# Patient Record
Sex: Female | Born: 1940 | Race: White | Hispanic: No | Marital: Married
Health system: Southern US, Community
[De-identification: ages and names within clinical notes are randomized; demographics above are authoritative.]

---

## 1998-07-14 ENCOUNTER — Other Ambulatory Visit: Admission: RE | Admit: 1998-07-14 | Discharge: 1998-07-14 | Payer: Self-pay | Admitting: Family Medicine

## 1999-10-25 ENCOUNTER — Encounter: Admission: RE | Admit: 1999-10-25 | Discharge: 1999-10-25 | Payer: Self-pay | Admitting: Family Medicine

## 1999-10-25 ENCOUNTER — Encounter: Payer: Self-pay | Admitting: Family Medicine

## 1999-11-02 ENCOUNTER — Other Ambulatory Visit: Admission: RE | Admit: 1999-11-02 | Discharge: 1999-11-02 | Payer: Self-pay | Admitting: Family Medicine

## 2000-08-24 ENCOUNTER — Encounter (INDEPENDENT_AMBULATORY_CARE_PROVIDER_SITE_OTHER): Payer: Self-pay | Admitting: Specialist

## 2000-08-24 ENCOUNTER — Inpatient Hospital Stay (HOSPITAL_COMMUNITY): Admission: EM | Admit: 2000-08-24 | Discharge: 2000-08-26 | Payer: Self-pay | Admitting: Emergency Medicine

## 2000-08-24 ENCOUNTER — Encounter: Admission: RE | Admit: 2000-08-24 | Discharge: 2000-08-24 | Payer: Self-pay | Admitting: Family Medicine

## 2000-08-24 ENCOUNTER — Encounter: Payer: Self-pay | Admitting: Family Medicine

## 2000-10-29 ENCOUNTER — Encounter: Admission: RE | Admit: 2000-10-29 | Discharge: 2000-10-29 | Payer: Self-pay | Admitting: Family Medicine

## 2000-10-29 ENCOUNTER — Encounter: Payer: Self-pay | Admitting: Family Medicine

## 2001-02-08 ENCOUNTER — Ambulatory Visit (HOSPITAL_COMMUNITY): Admission: RE | Admit: 2001-02-08 | Discharge: 2001-02-08 | Payer: Self-pay | Admitting: Family Medicine

## 2001-03-07 ENCOUNTER — Encounter: Admission: RE | Admit: 2001-03-07 | Discharge: 2001-03-07 | Payer: Self-pay | Admitting: Family Medicine

## 2001-03-07 ENCOUNTER — Encounter: Payer: Self-pay | Admitting: Family Medicine

## 2001-11-04 ENCOUNTER — Encounter: Payer: Self-pay | Admitting: Family Medicine

## 2001-11-04 ENCOUNTER — Encounter: Admission: RE | Admit: 2001-11-04 | Discharge: 2001-11-04 | Payer: Self-pay | Admitting: Family Medicine

## 2002-04-21 ENCOUNTER — Other Ambulatory Visit: Admission: RE | Admit: 2002-04-21 | Discharge: 2002-04-21 | Payer: Self-pay | Admitting: Family Medicine

## 2002-11-18 ENCOUNTER — Encounter (INDEPENDENT_AMBULATORY_CARE_PROVIDER_SITE_OTHER): Payer: Self-pay | Admitting: Specialist

## 2002-11-18 ENCOUNTER — Ambulatory Visit (HOSPITAL_COMMUNITY): Admission: RE | Admit: 2002-11-18 | Discharge: 2002-11-18 | Payer: Self-pay | Admitting: *Deleted

## 2002-11-24 ENCOUNTER — Encounter: Admission: RE | Admit: 2002-11-24 | Discharge: 2002-11-24 | Payer: Self-pay | Admitting: Family Medicine

## 2002-11-24 ENCOUNTER — Encounter: Payer: Self-pay | Admitting: Family Medicine

## 2003-05-18 ENCOUNTER — Encounter: Payer: Self-pay | Admitting: Orthopedic Surgery

## 2003-05-20 ENCOUNTER — Inpatient Hospital Stay (HOSPITAL_COMMUNITY): Admission: RE | Admit: 2003-05-20 | Discharge: 2003-05-23 | Payer: Self-pay | Admitting: Orthopedic Surgery

## 2003-11-26 ENCOUNTER — Encounter: Admission: RE | Admit: 2003-11-26 | Discharge: 2003-11-26 | Payer: Self-pay | Admitting: Family Medicine

## 2004-01-11 ENCOUNTER — Ambulatory Visit (HOSPITAL_BASED_OUTPATIENT_CLINIC_OR_DEPARTMENT_OTHER): Admission: RE | Admit: 2004-01-11 | Discharge: 2004-01-11 | Payer: Self-pay | Admitting: Orthopedic Surgery

## 2004-02-01 ENCOUNTER — Ambulatory Visit (HOSPITAL_BASED_OUTPATIENT_CLINIC_OR_DEPARTMENT_OTHER): Admission: RE | Admit: 2004-02-01 | Discharge: 2004-02-01 | Payer: Self-pay | Admitting: Orthopedic Surgery

## 2004-11-22 ENCOUNTER — Other Ambulatory Visit: Admission: RE | Admit: 2004-11-22 | Discharge: 2004-11-22 | Payer: Self-pay | Admitting: Family Medicine

## 2004-12-12 ENCOUNTER — Encounter: Admission: RE | Admit: 2004-12-12 | Discharge: 2004-12-12 | Payer: Self-pay | Admitting: Family Medicine

## 2006-01-05 ENCOUNTER — Encounter: Admission: RE | Admit: 2006-01-05 | Discharge: 2006-01-05 | Payer: Self-pay | Admitting: Family Medicine

## 2007-01-07 ENCOUNTER — Encounter: Admission: RE | Admit: 2007-01-07 | Discharge: 2007-01-07 | Payer: Self-pay | Admitting: Family Medicine

## 2008-01-10 ENCOUNTER — Encounter: Admission: RE | Admit: 2008-01-10 | Discharge: 2008-01-10 | Payer: Self-pay | Admitting: Family Medicine

## 2009-01-18 ENCOUNTER — Encounter: Admission: RE | Admit: 2009-01-18 | Discharge: 2009-01-18 | Payer: Self-pay | Admitting: Family Medicine

## 2010-01-20 ENCOUNTER — Encounter: Admission: RE | Admit: 2010-01-20 | Discharge: 2010-01-20 | Payer: Self-pay | Admitting: Family Medicine

## 2010-12-26 ENCOUNTER — Other Ambulatory Visit: Payer: Self-pay | Admitting: Family Medicine

## 2010-12-26 DIAGNOSIS — Z780 Asymptomatic menopausal state: Secondary | ICD-10-CM

## 2010-12-26 DIAGNOSIS — Z1239 Encounter for other screening for malignant neoplasm of breast: Secondary | ICD-10-CM

## 2011-01-23 ENCOUNTER — Ambulatory Visit: Payer: Self-pay

## 2011-01-23 ENCOUNTER — Other Ambulatory Visit: Payer: Self-pay

## 2011-02-01 ENCOUNTER — Ambulatory Visit
Admission: RE | Admit: 2011-02-01 | Discharge: 2011-02-01 | Disposition: A | Discharge status: Home / Self Care | Payer: Medicare Other | Source: Ambulatory Visit | Attending: Family Medicine | Admitting: Family Medicine

## 2011-02-01 DIAGNOSIS — Z1239 Encounter for other screening for malignant neoplasm of breast: Secondary | ICD-10-CM

## 2011-02-01 DIAGNOSIS — Z780 Asymptomatic menopausal state: Secondary | ICD-10-CM

## 2011-04-21 NOTE — Op Note (Signed)
[name], [name]                           ACCOUNT NO.:  [id number]   MEDICAL RECORD NO.:  [id number]                   PATIENT TYPE:  INP   LOCATION:  2550                                 FACILITY:  [facility]   PHYSICIAN:  Robert A. Thurston Hole, M.D.              DATE OF BIRTH:  09-02-1941   DATE OF PROCEDURE:  05/20/2003  DATE OF DISCHARGE:                                 OPERATIVE REPORT   PREOPERATIVE DIAGNOSIS:  Right knee degenerative joint disease.   POSTOPERATIVE DIAGNOSIS:  Right knee degenerative joint disease.   PROCEDURES:  1. Right total knee replacement using [employer] Scorpio total knee system     with number 9 submitted femoral component, number 7 submitted tibial     component with 12-mm polyethelene flex tibial spacer, 26-mm polyethelene     submitted patella component.  2. Right knee lateral retinacular release.   SURGEON:  Dr. Salvatore Marvel.   ASSISTANT:  Kirstin Tomasa Rand, P.A.   ANESTHESIA:  General.   OPERATIVE TIME:  One hour 25 minutes.   COMPLICATIONS:  None.   DESCRIPTION OF PROCEDURE:  [name] was brought to the operating room on  May 20, 2003, placed on the operative table in the supine position.  After  an adequate level of general anesthesia was obtained, her right knee was  examined under anesthesia.  Range of motion from -5 to 125 degrees with mild  varus deformity.  Knee with stable ligamentous exam with normal patella  tracking.  She had a Foley catheter placed under sterile conditions and  received Ancef 1 gram IV preoperatively for prophylaxis.  Her right leg was  prepped using sterile Duraprep and draped using sterile technique.  Leg was  then exsanguinated and a thigh tourniquet elevated at 375 mm.  Initially,  through a 15-cm longitudinal incision, based over the patella, initial  exposure was made.  The underlying subcutaneous tissues were incised in line  with the skin incision.  A median arthrotomy was performed, revealing an  excessive amount of normal-appearing joint fluid.  The articular surfaces  were inspected.  She was found to have grade 4 changes medially, grade 3 and  4 changes laterally, and grade 3 and 4 changes in the patellofemoral joint.  The medial and lateral meniscal remnants were removed as well as osteophytes  off the femoral condyles and tibial plateau.  Anterior cruciate ligament was  resected.  An intermedullary drill was then drilled up the femoral canal for  placement of the distal femoral cutting jig, which was placed in the  appropriate amount of rotation, and a distal 12-mm cut was made.  The distal  femur was incised.  A number 9 was found to be the appropriate size, and a  number 9 cutting jig was placed, and then these cuts were made.  After this  was done, the proximal tibia was exposed; tibial spines were removed with  an  oscillating saw.  Intermedullary drill drilled down the tibial canal for  placement of the proximal tibial cutting jig, which was placed in the  appropriate amount of rotation, and then a 6-mm proximal cut was made.  After this was done, the 8 Scorpio PCL cutters were placed back on the  distal femur, and these cuts were made.  After this was done, the number 9  femoral trial component was placed, a number 7 tibial-based plate trial  component was placed, and with a 12-mm polyethelene flex tibial spacer,  there was found to be excellent restoration of normal alignment, excellent  stability with range of motion from 0 to 120 degrees with no lift-off on the  tray.  The tibial-based plate was then marked for rotation, and the keel cut  was made.  After this was done, the patella was sized; a 26-mm was found to  be the appropriate size, and a recessed 10 mm x 26 mm cut was made, and  three locking holes were placed.  After this was done, it was felt that all  the components were of excellent size, fit, and stability; they were then  removed.  The knee was then gently  lavage-irrigated with 3 liters of saline  solution.  The proximal tibia was then exposed, and the number 7 tibial-base  plate with cement backing was hammered into position with an excellent fit  with excess cement being removed from around the edges.  The number 9  femoral component with cement backing was hammered into position, also with  an excellent fit with excess cement being removed from around the edges.  The 12-mm polyethelene flex tibial spacer was then locked on the tibial base  plate, the knee taken through a range of motion, 0 to 120 degrees, with  excellent stability with restoration of normal alignment.  The 26-mm  polyethelene cement backed patella was then locked into its recessed hole  and held there with a clamp.  After this was done, it was felt that all of  the components were of excellent size, fit, and stability.  There was  significant lateral patella tracking noted and tightness, and thus a lateral  retinacular release was carried out, and this improved patella tracking to  normal.  After this was done, it was felt that all of the components were of  excellent size, fit, and stability with normal patella tracking.  The wound  was further irrigated, and then the arthrotomy was closed with number 1  Ethibond suture over two medium Hemovac drains.  Subcutaneous tissue was  closed with 0 and 2-0 Vicryl.  Skin closed with skin staples.  Sterile  dressings were applied.  Hemovac injected with 0.25% Marcaine with  epinephrine and 4 mg of morphine and clamped.  Tourniquet was released.  Patient then had a femoral nerve block placed by anesthesia for  postoperative pain control.  She was then awakened and extubated and taken  to the recovery room in stable condition.  Needle and sponge count correct  times two at the end of the case.                                               Robert A. Thurston Hole, M.D.    RAW/MEDQ  D:  05/20/2003  T:  05/20/2003  Job:  [id number]

## 2011-04-21 NOTE — Op Note (Signed)
[facility]  Patient:    Tamara [name], Tamara [name]                          MRN: [id number] Proc. Date: 08/25/00 Adm. Date:  [id number] Disc. Date: [id number] Attending:  Meredith Leeds CC:         Maricela Bo, M.D.   Operative Report  PREOPERATIVE DIAGNOSIS:  Cholelithiasis and chronic cholecystitis with possible choledocholithiasis.  POSTOPERATIVE DIAGNOSIS:  Cholelithiasis and chronic cholecystitis with possible choledocholithiasis.  OPERATION PERFORMED:  Laparoscopic cholecystectomy with cholangiogram.  SURGEON:  Zigmund Daniel, M.D.  ASSISTANT:  Currie Paris, M.D.  ANESTHESIA:  General.  DESCRIPTION OF PROCEDURE:  After adequate general anesthesia and routine preparation and draping of the abdomen, I made a short transverse infraumbilical incision and dissected down through the fat to the fascia and opened it longitudinally in the midline.  I then opened the peritoneum bluntly and placed a 0 Vicryl pursestring suture and used it to secure a Hasson cannula.  After inflating the abdomen with CO2, I noted no abnormalities except for adhesions in the pelvis from previous surgery and moderately inflamed appearance of the gallbladder.  The liver appeared to be normal.  I placed three additional ports and positioned the patient routinely head up and left tilted and elevated the fundus of the gallbladder.  I took down the adhesions carefully as the colon was closely adherent to the gallbladder.  I ensured that no injury occurred.   I then retracted the infundibulum of the gallbladder laterally and dissected out the cystic duct and the cystic artery. I clearly identified the cystic duct emerging from the infundibulum of the gallbladder.  I put a clip across the most proximal part of the cytic duct, then made a cut in it and inserted a cholangiogram catheter for cholangiogram because the patient had recent history of dark urine and had  moderately elevated transaminases on her preoperative lab.  The cholangiogram showed a generous sized common bile duct but it tapered very nicely and there were no filling defects and there was rapid and easy drainage into the duodenum.  I felt that it did not show any choledocholithiasis.  I did remove one small stone from the cystic duct when I made the hole in it.  After removing the cholangiogram catheter, I then placed two clips distally on the cystic duct and divided it.  I placed three clips on the cystic artery and divided betweem the two closer to the liver. I then dissected the liver from the gallbladder using the spatula cautery.  One small hole was made in it and I irrigated the bile away.  After detaching the gallbladder from the liver, I placed it in a plastic pouch and removed it through the umbilical incision and tied the pursestring suture.  I got good hemostasis in the gallbladder fossa using cautery.  I copiously irrigated the right upper quadrant and removed the irrigant.  The patient tolerated the procedure well.  I anesthetized all the incisions with 0.5% Marcaine with epinephrine.  I removed the lateral ports ____________  and saw that there was no bleeding.  After releasing the CO2 I closed all incisions with intercuticular 4-0 Vicryl and Steri-Strips.  The patient tolerated the procedure well.DD:  08/25/00 TD:  08/27/00 Job: [id number]

## 2011-04-21 NOTE — Discharge Summary (Signed)
NAMEAZARAH, DACY                           ACCOUNT NO.:  [id number]   MEDICAL RECORD NO.:  [id number]                   PATIENT TYPE:  INP   LOCATION:  5012                                 FACILITY:  [facility]   PHYSICIAN:  Elana Alm. Thurston Hole, M.D.              DATE OF BIRTH:  [birth year]   DATE OF ADMISSION:  05/20/2003  DATE OF DISCHARGE:  05/23/2003                                 DISCHARGE SUMMARY   ADMISSION DIAGNOSES:  1. End-stage degenerative joint disease right knee.  2. Depression.   DISCHARGE DIAGNOSES:  1. End-stage degenerative joint disease right knee.  2. Depression.   HISTORY OF PRESENT ILLNESS:  The patient is a 70 year old white female with  a history of end-stage DJD of her right knee.  She has tried anti-  inflammatories and cortisone, all without success.  At this point in time,  she has pain at night, pain at rest, pain with ambulation.  She understands  the risks and benefits and possible complications of a right total knee  replacement and is without question.   PROCEDURE:  On May 20, 2003, the patient underwent a right total knee  replacement by Dr. Thurston Hole and a right femoral nerve block by anesthesia.   HOSPITAL COURSE:  She was admitted postoperatively for DVT prophylaxis, pain  control and physical therapy.  On postoperative day #1, T-max was 100.7,  hemoglobin 10.6, INR 1.0.  Surgical wound was well approximated.  Her drain  was discontinued.  Her PCA was discontinued.  She was started on Percocet  for pain and started with physical therapy.  She ambulated five feet with  minimal assist.   On postoperative day #2, T-max was 100.7, hemoglobin 10.2, INR 1.4.  Surgical wound was well approximated.  Her IV was Hep-Locked.  Her Foley was  discontinued.  Discharge planning was started.  She continued with physical  therapy and continued to progress.  On postoperative day #3, the patient was  discharged to home in stable condition.  She was afebrile.  Her  INR was 1.4.  Her hemoglobin was 9.9.  She was metabolically stable.   DISPOSITION:  She was discharged to home, weightbearing as tolerated, on a  regular diet.   DISCHARGE MEDICATIONS:  1. Percocet one to two q.4-6h. p.r.n. pain.  2. Coumadin 2.5 mg three tablets daily.  3. Zoloft 100 mg one tablet daily.  4. Xanax 0.25 mg p.r.n.  5. Iron 325 mg one p.o. b.i.d.  6. Colace 100 mg one p.o. b.i.d.  7. Senokot S two tablets before dinner.    DISCHARGE INSTRUCTIONS:  She will be receiving home health physical therapy  and a nurse to draw her Coumadin levels.  She has been instructed to call  with temperature greater than 100, increased pain, increased drainage.  She  will also call to make an appointment to follow up with Dr. Thurston Hole May 26, 2003, or May 28, 2003.      Kirstin Shepperson, P.A.                  Robert A. Thurston Hole, M.D.    KS/MEDQ  D:  07/01/2003  T:  07/02/2003  Job:  [id number]

## 2011-04-21 NOTE — Op Note (Signed)
[name], Tamara [name]                           ACCOUNT NO.:  [id number]   MEDICAL RECORD NO.:  [id number]                   PATIENT TYPE:  AMB   LOCATION:  DSC                                  FACILITY:  [facility]   PHYSICIAN:  Robert A. Thurston Hole, M.D.              DATE OF BIRTH:  [birth year]   DATE OF PROCEDURE:  01/11/2004  DATE OF DISCHARGE:                                 OPERATIVE REPORT   PREOPERATIVE DIAGNOSIS:  Right carpal tunnel syndrome.   POSTOPERATIVE DIAGNOSIS:  Right carpal tunnel syndrome.   OPERATION PERFORMED:  Right carpal tunnel release.   SURGEON:  Elana Alm. Thurston Hole, M.D.   ASSISTANT:  Julien Girt, P.A.   ANESTHESIA:  IV regional.   OPERATIVE TIME:  20 minutes.   COMPLICATIONS:  None.   INDICATIONS FOR PROCEDURE:  [name] is a 70 year old woman who has had  painful recurrent right carpal tunnel syndrome not responsive to  conservative care and is now to undergo right carpal tunnel release.   DESCRIPTION OF PROCEDURE:  [name] was brought to the operating room on  January 11, 2004 and placed on the operating table in the supine position.  After an adequate level of intravenous regional anesthesia was obtained, the  right hand and arm was prepped using sterile technique.  Initially through a  4 cm palmar incision, initial exposure was made.  The underlying  subcutaneous tissues were incised in line with the skin incision.  The  transverse carpal ligament was exposed at the level of the wrist flexion  crease and carefully incised with a hemostat used to protect the median  nerve.  The entire transverse carpal ligament was released distally at the  level of the superficial palmar arch carefully protecting this and released  proximally approximately 3 inches proximal to the wrist flexion crease,  carefully protecting the palmar cutaneous branch of the median nerve.  The  median nerve itself was found to be significantly flattened and compressed  but no  other pathology was noted.  The wound was then irrigated and closed  using interrupted 4-0 nylon suture and injected with 0.25% Marcaine.  Sterile dressings were applied.  Tourniquet was released and the patient was  awakened and taken to recovery room in stable condition.   FOLLOW UP:  [name] will be followed as an outpatient on Vicodin and  Naprosyn.  See her back in the office in a week for wound check and follow  up.                                               Robert A. Thurston Hole, M.D.    RAW/MEDQ  D:  01/11/2004  T:  01/11/2004  Job:  [id number]

## 2011-04-21 NOTE — Op Note (Signed)
Tamara [name], Tamara [name]                           ACCOUNT NO.:  [id number]   MEDICAL RECORD NO.:  [id number]                   PATIENT TYPE:  AMB   LOCATION:  DSC                                  FACILITY:  [facility]   PHYSICIAN:  Robert A. Thurston Hole, M.D.              DATE OF BIRTH:  11/16/1941   DATE OF PROCEDURE:  02/01/2004  DATE OF DISCHARGE:                                 OPERATIVE REPORT   PREOPERATIVE DIAGNOSIS:  Left carpal tunnel syndrome.   POSTOPERATIVE DIAGNOSIS:  Left carpal tunnel syndrome.   PROCEDURE:  Left carpal tunnel release.   SURGEON:  Elana Alm. Thurston Hole, M.D.   ASSISTANT:  Julien Girt, P.A.   ANESTHESIA:  IV regional.   OPERATIVE TIME:  Was 20 minutes.   COMPLICATIONS:  None.   INDICATIONS FOR PROCEDURE:  The patient is a 70 year old woman who has had  significant left carpal tunnel syndrome longstanding, increasing in nature,  not responsive to conservative care, and is now to undergo a release.   DESCRIPTION OF PROCEDURE:  The patient was brought to the operating room on  February 01, 2004, and placed on the operating room table in the supine  position.  After an adequate level of IV regional anesthesia was obtained,  her left arm and hand were prepped using sterile DuraPrep and draped using a  sterile technique.  Originally through a 4.0 cm palmar incision, initial  exposure was made.  The underlying subcutaneous tissues were incised in line  with the skin incision.  The transverse carpal ligament was exposed at a  level of the wrist flexion crease and carefully incised revealing the  underlying median nerve, which was carefully protected with a hemostat,  while the entire transverse carpal ligament was released distally to the  level of the superficial palmar arch, carefully protecting this, and  released proximally approximately three inches proximal to the wrist flexion  crease, carefully protecting the palmar cutaneous branch of the median  nerve.   The median nerve itself was found to be significantly flattened and  compressed, but no other pathology was noted.  The wound was then irrigated  and closed using interrupted #3-0 nylon suture and injected with 0.25%  Marcaine.  Sterile compressive dressings were applied.  The tourniquet was  released and the patient was awakened and taken to the recovery room in  stable condition.   FOLLOW-UP CARE:  The patient will be followed as an outpatient on Vicodin  and Naprosyn.  I will see her back in the office in one week for wound check  and followup.                                               Robert A. Thurston Hole, M.D.    RAW/MEDQ  D:  02/01/2004  T:  02/01/2004  Job:  [id number]

## 2012-01-26 ENCOUNTER — Other Ambulatory Visit: Payer: Self-pay | Admitting: Family Medicine

## 2012-01-26 DIAGNOSIS — Z1231 Encounter for screening mammogram for malignant neoplasm of breast: Secondary | ICD-10-CM

## 2012-02-08 ENCOUNTER — Ambulatory Visit
Admission: RE | Admit: 2012-02-08 | Discharge: 2012-02-08 | Disposition: A | Discharge status: Home / Self Care | Payer: Medicare Other | Source: Ambulatory Visit | Attending: Family Medicine | Admitting: Family Medicine

## 2012-02-08 DIAGNOSIS — Z1231 Encounter for screening mammogram for malignant neoplasm of breast: Secondary | ICD-10-CM

## 2013-02-04 ENCOUNTER — Other Ambulatory Visit: Payer: Self-pay

## 2013-02-04 DIAGNOSIS — Z1231 Encounter for screening mammogram for malignant neoplasm of breast: Secondary | ICD-10-CM

## 2013-03-05 ENCOUNTER — Ambulatory Visit
Admission: RE | Admit: 2013-03-05 | Discharge: 2013-03-05 | Disposition: A | Discharge status: Home / Self Care | Payer: Medicare Other | Source: Ambulatory Visit

## 2013-03-05 DIAGNOSIS — Z1231 Encounter for screening mammogram for malignant neoplasm of breast: Secondary | ICD-10-CM

## 2014-02-24 ENCOUNTER — Other Ambulatory Visit: Payer: Self-pay | Admitting: Family Medicine

## 2014-02-24 DIAGNOSIS — Z1231 Encounter for screening mammogram for malignant neoplasm of breast: Secondary | ICD-10-CM

## 2014-03-06 ENCOUNTER — Ambulatory Visit
Admission: RE | Admit: 2014-03-06 | Discharge: 2014-03-06 | Disposition: A | Discharge status: Home / Self Care | Payer: Medicare Other | Source: Ambulatory Visit | Attending: Family Medicine | Admitting: Family Medicine

## 2014-03-06 DIAGNOSIS — Z1231 Encounter for screening mammogram for malignant neoplasm of breast: Secondary | ICD-10-CM

## 2015-02-01 DIAGNOSIS — Z79899 Other long term (current) drug therapy: Secondary | ICD-10-CM | POA: Diagnosis not present

## 2015-02-01 DIAGNOSIS — E1165 Type 2 diabetes mellitus with hyperglycemia: Secondary | ICD-10-CM | POA: Diagnosis not present

## 2015-02-01 DIAGNOSIS — E780 Pure hypercholesterolemia: Secondary | ICD-10-CM | POA: Diagnosis not present

## 2015-02-01 DIAGNOSIS — F418 Other specified anxiety disorders: Secondary | ICD-10-CM | POA: Diagnosis not present

## 2015-02-22 ENCOUNTER — Other Ambulatory Visit: Payer: Self-pay

## 2015-02-22 DIAGNOSIS — Z1231 Encounter for screening mammogram for malignant neoplasm of breast: Secondary | ICD-10-CM

## 2015-03-12 ENCOUNTER — Ambulatory Visit
Admission: RE | Admit: 2015-03-12 | Discharge: 2015-03-12 | Disposition: A | Discharge status: Home / Self Care | Payer: Medicare Other | Source: Ambulatory Visit

## 2015-03-12 DIAGNOSIS — Z1231 Encounter for screening mammogram for malignant neoplasm of breast: Secondary | ICD-10-CM

## 2015-03-15 DIAGNOSIS — H40003 Preglaucoma, unspecified, bilateral: Secondary | ICD-10-CM | POA: Diagnosis not present

## 2015-08-05 DIAGNOSIS — F418 Other specified anxiety disorders: Secondary | ICD-10-CM | POA: Diagnosis not present

## 2015-08-05 DIAGNOSIS — Z139 Encounter for screening, unspecified: Secondary | ICD-10-CM | POA: Diagnosis not present

## 2015-08-05 DIAGNOSIS — Z9181 History of falling: Secondary | ICD-10-CM | POA: Diagnosis not present

## 2015-08-05 DIAGNOSIS — M159 Polyosteoarthritis, unspecified: Secondary | ICD-10-CM | POA: Diagnosis not present

## 2015-08-05 DIAGNOSIS — E1165 Type 2 diabetes mellitus with hyperglycemia: Secondary | ICD-10-CM | POA: Diagnosis not present

## 2015-08-05 DIAGNOSIS — E780 Pure hypercholesterolemia: Secondary | ICD-10-CM | POA: Diagnosis not present

## 2015-12-01 DIAGNOSIS — H6123 Impacted cerumen, bilateral: Secondary | ICD-10-CM | POA: Diagnosis not present

## 2015-12-01 DIAGNOSIS — J0100 Acute maxillary sinusitis, unspecified: Secondary | ICD-10-CM | POA: Diagnosis not present

## 2016-02-02 ENCOUNTER — Other Ambulatory Visit: Payer: Self-pay

## 2016-02-02 DIAGNOSIS — Z1231 Encounter for screening mammogram for malignant neoplasm of breast: Secondary | ICD-10-CM

## 2016-02-03 ENCOUNTER — Other Ambulatory Visit: Payer: Self-pay | Admitting: Family Medicine

## 2016-02-03 DIAGNOSIS — M159 Polyosteoarthritis, unspecified: Secondary | ICD-10-CM | POA: Diagnosis not present

## 2016-02-03 DIAGNOSIS — Z1389 Encounter for screening for other disorder: Secondary | ICD-10-CM | POA: Diagnosis not present

## 2016-02-03 DIAGNOSIS — E1165 Type 2 diabetes mellitus with hyperglycemia: Secondary | ICD-10-CM | POA: Diagnosis not present

## 2016-02-03 DIAGNOSIS — E2839 Other primary ovarian failure: Secondary | ICD-10-CM

## 2016-02-03 DIAGNOSIS — E7800 Pure hypercholesterolemia, unspecified: Secondary | ICD-10-CM | POA: Diagnosis not present

## 2016-02-28 DIAGNOSIS — E119 Type 2 diabetes mellitus without complications: Secondary | ICD-10-CM | POA: Diagnosis not present

## 2016-02-28 DIAGNOSIS — H401130 Primary open-angle glaucoma, bilateral, stage unspecified: Secondary | ICD-10-CM | POA: Diagnosis not present

## 2016-03-13 ENCOUNTER — Ambulatory Visit
Admission: RE | Admit: 2016-03-13 | Discharge: 2016-03-13 | Disposition: A | Discharge status: Home / Self Care | Payer: Medicare Other | Source: Ambulatory Visit | Attending: Family Medicine | Admitting: Family Medicine

## 2016-03-13 ENCOUNTER — Ambulatory Visit
Admission: RE | Admit: 2016-03-13 | Discharge: 2016-03-13 | Disposition: A | Discharge status: Home / Self Care | Payer: Medicare Other | Source: Ambulatory Visit

## 2016-03-13 ENCOUNTER — Ambulatory Visit: Payer: Medicare Other

## 2016-03-13 DIAGNOSIS — Z780 Asymptomatic menopausal state: Secondary | ICD-10-CM | POA: Diagnosis not present

## 2016-03-13 DIAGNOSIS — E2839 Other primary ovarian failure: Secondary | ICD-10-CM

## 2016-03-13 DIAGNOSIS — Z1231 Encounter for screening mammogram for malignant neoplasm of breast: Secondary | ICD-10-CM | POA: Diagnosis not present

## 2016-03-13 DIAGNOSIS — Z13820 Encounter for screening for osteoporosis: Secondary | ICD-10-CM | POA: Diagnosis not present

## 2016-08-01 DIAGNOSIS — L821 Other seborrheic keratosis: Secondary | ICD-10-CM | POA: Diagnosis not present

## 2016-08-01 DIAGNOSIS — L814 Other melanin hyperpigmentation: Secondary | ICD-10-CM | POA: Diagnosis not present

## 2016-08-01 DIAGNOSIS — L820 Inflamed seborrheic keratosis: Secondary | ICD-10-CM | POA: Diagnosis not present

## 2016-08-08 DIAGNOSIS — M159 Polyosteoarthritis, unspecified: Secondary | ICD-10-CM | POA: Diagnosis not present

## 2016-08-08 DIAGNOSIS — E1165 Type 2 diabetes mellitus with hyperglycemia: Secondary | ICD-10-CM | POA: Diagnosis not present

## 2016-08-08 DIAGNOSIS — Z1389 Encounter for screening for other disorder: Secondary | ICD-10-CM | POA: Diagnosis not present

## 2016-08-08 DIAGNOSIS — Z9181 History of falling: Secondary | ICD-10-CM | POA: Diagnosis not present

## 2016-08-08 DIAGNOSIS — E7800 Pure hypercholesterolemia, unspecified: Secondary | ICD-10-CM | POA: Diagnosis not present

## 2017-02-01 ENCOUNTER — Other Ambulatory Visit: Payer: Self-pay | Admitting: Nurse Practitioner

## 2017-02-01 DIAGNOSIS — Z1231 Encounter for screening mammogram for malignant neoplasm of breast: Secondary | ICD-10-CM

## 2017-03-12 DIAGNOSIS — I10 Essential (primary) hypertension: Secondary | ICD-10-CM | POA: Diagnosis not present

## 2017-03-14 ENCOUNTER — Ambulatory Visit
Admission: RE | Admit: 2017-03-14 | Discharge: 2017-03-14 | Disposition: A | Discharge status: Home / Self Care | Payer: Medicare Other | Source: Ambulatory Visit | Attending: Nurse Practitioner | Admitting: Nurse Practitioner

## 2017-03-14 DIAGNOSIS — Z1231 Encounter for screening mammogram for malignant neoplasm of breast: Secondary | ICD-10-CM

## 2017-04-19 DIAGNOSIS — E7800 Pure hypercholesterolemia, unspecified: Secondary | ICD-10-CM | POA: Diagnosis not present

## 2017-04-19 DIAGNOSIS — E1165 Type 2 diabetes mellitus with hyperglycemia: Secondary | ICD-10-CM | POA: Diagnosis not present

## 2017-04-19 DIAGNOSIS — I10 Essential (primary) hypertension: Secondary | ICD-10-CM | POA: Diagnosis not present

## 2017-06-28 DIAGNOSIS — Z1389 Encounter for screening for other disorder: Secondary | ICD-10-CM | POA: Diagnosis not present

## 2017-06-28 DIAGNOSIS — E755 Other lipid storage disorders: Secondary | ICD-10-CM | POA: Diagnosis not present

## 2017-06-28 DIAGNOSIS — E785 Hyperlipidemia, unspecified: Secondary | ICD-10-CM | POA: Diagnosis not present

## 2017-06-28 DIAGNOSIS — Z0000 Encounter for general adult medical examination without abnormal findings: Secondary | ICD-10-CM | POA: Diagnosis not present

## 2017-06-28 DIAGNOSIS — Z9181 History of falling: Secondary | ICD-10-CM | POA: Diagnosis not present

## 2017-07-17 DIAGNOSIS — Z139 Encounter for screening, unspecified: Secondary | ICD-10-CM | POA: Diagnosis not present

## 2017-07-17 DIAGNOSIS — R5383 Other fatigue: Secondary | ICD-10-CM | POA: Diagnosis not present

## 2017-08-16 DIAGNOSIS — E1165 Type 2 diabetes mellitus with hyperglycemia: Secondary | ICD-10-CM | POA: Diagnosis not present

## 2017-08-16 DIAGNOSIS — Z1389 Encounter for screening for other disorder: Secondary | ICD-10-CM | POA: Diagnosis not present

## 2017-08-16 DIAGNOSIS — M545 Low back pain: Secondary | ICD-10-CM | POA: Diagnosis not present

## 2017-08-16 DIAGNOSIS — Z9181 History of falling: Secondary | ICD-10-CM | POA: Diagnosis not present

## 2017-08-16 DIAGNOSIS — E7800 Pure hypercholesterolemia, unspecified: Secondary | ICD-10-CM | POA: Diagnosis not present

## 2017-08-20 DIAGNOSIS — E1165 Type 2 diabetes mellitus with hyperglycemia: Secondary | ICD-10-CM | POA: Diagnosis not present

## 2017-08-20 DIAGNOSIS — E7800 Pure hypercholesterolemia, unspecified: Secondary | ICD-10-CM | POA: Diagnosis not present

## 2017-09-20 DIAGNOSIS — R413 Other amnesia: Secondary | ICD-10-CM | POA: Diagnosis not present

## 2017-09-20 DIAGNOSIS — E7800 Pure hypercholesterolemia, unspecified: Secondary | ICD-10-CM | POA: Diagnosis not present

## 2017-09-20 DIAGNOSIS — E119 Type 2 diabetes mellitus without complications: Secondary | ICD-10-CM | POA: Diagnosis not present

## 2017-09-20 DIAGNOSIS — Z23 Encounter for immunization: Secondary | ICD-10-CM | POA: Diagnosis not present

## 2017-09-20 DIAGNOSIS — Z79899 Other long term (current) drug therapy: Secondary | ICD-10-CM | POA: Diagnosis not present

## 2017-11-25 DIAGNOSIS — J209 Acute bronchitis, unspecified: Secondary | ICD-10-CM | POA: Diagnosis not present

## 2018-02-04 ENCOUNTER — Other Ambulatory Visit: Payer: Self-pay | Admitting: Nurse Practitioner

## 2018-02-04 DIAGNOSIS — Z1231 Encounter for screening mammogram for malignant neoplasm of breast: Secondary | ICD-10-CM

## 2018-02-22 DIAGNOSIS — E119 Type 2 diabetes mellitus without complications: Secondary | ICD-10-CM | POA: Diagnosis not present

## 2018-02-22 DIAGNOSIS — H401130 Primary open-angle glaucoma, bilateral, stage unspecified: Secondary | ICD-10-CM | POA: Diagnosis not present

## 2018-03-18 ENCOUNTER — Ambulatory Visit
Admission: RE | Admit: 2018-03-18 | Discharge: 2018-03-18 | Disposition: A | Discharge status: Home / Self Care | Payer: Medicare Other | Source: Ambulatory Visit | Attending: Nurse Practitioner | Admitting: Nurse Practitioner

## 2018-03-18 DIAGNOSIS — Z1231 Encounter for screening mammogram for malignant neoplasm of breast: Secondary | ICD-10-CM

## 2018-03-21 DIAGNOSIS — E1165 Type 2 diabetes mellitus with hyperglycemia: Secondary | ICD-10-CM | POA: Diagnosis not present

## 2018-03-21 DIAGNOSIS — E7800 Pure hypercholesterolemia, unspecified: Secondary | ICD-10-CM | POA: Diagnosis not present

## 2018-03-26 DIAGNOSIS — E1165 Type 2 diabetes mellitus with hyperglycemia: Secondary | ICD-10-CM | POA: Diagnosis not present

## 2018-03-26 DIAGNOSIS — E785 Hyperlipidemia, unspecified: Secondary | ICD-10-CM | POA: Diagnosis not present

## 2018-03-26 DIAGNOSIS — R8290 Unspecified abnormal findings in urine: Secondary | ICD-10-CM | POA: Diagnosis not present

## 2018-03-26 DIAGNOSIS — R030 Elevated blood-pressure reading, without diagnosis of hypertension: Secondary | ICD-10-CM | POA: Diagnosis not present

## 2018-03-26 DIAGNOSIS — Z79899 Other long term (current) drug therapy: Secondary | ICD-10-CM | POA: Diagnosis not present

## 2018-05-03 DIAGNOSIS — E7800 Pure hypercholesterolemia, unspecified: Secondary | ICD-10-CM | POA: Diagnosis not present

## 2018-05-03 DIAGNOSIS — E1165 Type 2 diabetes mellitus with hyperglycemia: Secondary | ICD-10-CM | POA: Diagnosis not present

## 2018-05-03 DIAGNOSIS — E785 Hyperlipidemia, unspecified: Secondary | ICD-10-CM | POA: Diagnosis not present

## 2018-05-25 DIAGNOSIS — M2550 Pain in unspecified joint: Secondary | ICD-10-CM | POA: Diagnosis not present

## 2018-05-25 DIAGNOSIS — R509 Fever, unspecified: Secondary | ICD-10-CM | POA: Diagnosis not present

## 2018-05-25 DIAGNOSIS — M654 Radial styloid tenosynovitis [de Quervain]: Secondary | ICD-10-CM | POA: Diagnosis not present

## 2018-06-28 DIAGNOSIS — E785 Hyperlipidemia, unspecified: Secondary | ICD-10-CM | POA: Diagnosis not present

## 2018-06-28 DIAGNOSIS — R030 Elevated blood-pressure reading, without diagnosis of hypertension: Secondary | ICD-10-CM | POA: Diagnosis not present

## 2018-06-28 DIAGNOSIS — E119 Type 2 diabetes mellitus without complications: Secondary | ICD-10-CM | POA: Diagnosis not present

## 2018-07-08 DIAGNOSIS — Z0000 Encounter for general adult medical examination without abnormal findings: Secondary | ICD-10-CM | POA: Diagnosis not present

## 2018-07-08 DIAGNOSIS — Z139 Encounter for screening, unspecified: Secondary | ICD-10-CM | POA: Diagnosis not present

## 2018-07-08 DIAGNOSIS — E785 Hyperlipidemia, unspecified: Secondary | ICD-10-CM | POA: Diagnosis not present

## 2018-07-08 DIAGNOSIS — Z9181 History of falling: Secondary | ICD-10-CM | POA: Diagnosis not present

## 2018-07-08 DIAGNOSIS — Z136 Encounter for screening for cardiovascular disorders: Secondary | ICD-10-CM | POA: Diagnosis not present

## 2018-07-17 DIAGNOSIS — E1165 Type 2 diabetes mellitus with hyperglycemia: Secondary | ICD-10-CM | POA: Diagnosis not present

## 2018-09-30 DIAGNOSIS — E785 Hyperlipidemia, unspecified: Secondary | ICD-10-CM | POA: Diagnosis not present

## 2018-09-30 DIAGNOSIS — E119 Type 2 diabetes mellitus without complications: Secondary | ICD-10-CM | POA: Diagnosis not present

## 2018-09-30 DIAGNOSIS — Z23 Encounter for immunization: Secondary | ICD-10-CM | POA: Diagnosis not present

## 2018-09-30 DIAGNOSIS — R030 Elevated blood-pressure reading, without diagnosis of hypertension: Secondary | ICD-10-CM | POA: Diagnosis not present

## 2018-10-18 DIAGNOSIS — E785 Hyperlipidemia, unspecified: Secondary | ICD-10-CM | POA: Diagnosis not present

## 2018-10-18 DIAGNOSIS — J209 Acute bronchitis, unspecified: Secondary | ICD-10-CM | POA: Diagnosis not present

## 2019-01-31 DIAGNOSIS — E785 Hyperlipidemia, unspecified: Secondary | ICD-10-CM | POA: Diagnosis not present

## 2019-01-31 DIAGNOSIS — E119 Type 2 diabetes mellitus without complications: Secondary | ICD-10-CM | POA: Diagnosis not present

## 2019-01-31 DIAGNOSIS — I10 Essential (primary) hypertension: Secondary | ICD-10-CM | POA: Diagnosis not present

## 2019-01-31 DIAGNOSIS — M159 Polyosteoarthritis, unspecified: Secondary | ICD-10-CM | POA: Diagnosis not present

## 2019-02-06 ENCOUNTER — Other Ambulatory Visit: Payer: Self-pay | Admitting: Nurse Practitioner

## 2019-02-06 DIAGNOSIS — Z1231 Encounter for screening mammogram for malignant neoplasm of breast: Secondary | ICD-10-CM

## 2019-02-06 DIAGNOSIS — R5381 Other malaise: Secondary | ICD-10-CM

## 2019-02-11 ENCOUNTER — Other Ambulatory Visit: Payer: Self-pay | Admitting: Nurse Practitioner

## 2019-02-11 DIAGNOSIS — E2839 Other primary ovarian failure: Secondary | ICD-10-CM

## 2019-03-31 ENCOUNTER — Other Ambulatory Visit: Payer: Self-pay

## 2019-03-31 NOTE — Patient Outreach (Signed)
[facility]) Care Management  03/31/2019  Tamara [name] Jun 23, 1941 [id number]   Medication Adherence call to Tamara [name] HIPPA Compliant Voice message left with a call back number. Tamara [name] is showing past due on Losartan 50 mg under [facility] Ins.   Tamara [name] Tamara Technician [facility] Management Direct Dial [phone]  Fax [phone] [email or web address]

## 2019-04-17 ENCOUNTER — Other Ambulatory Visit: Payer: Medicare Other

## 2019-04-17 ENCOUNTER — Ambulatory Visit: Payer: Medicare Other

## 2019-04-25 DIAGNOSIS — N390 Urinary tract infection, site not specified: Secondary | ICD-10-CM | POA: Diagnosis not present

## 2019-05-19 ENCOUNTER — Other Ambulatory Visit: Payer: Self-pay

## 2019-05-19 ENCOUNTER — Ambulatory Visit
Admission: RE | Admit: 2019-05-19 | Discharge: 2019-05-19 | Disposition: A | Discharge status: Home / Self Care | Payer: Medicare Other | Source: Ambulatory Visit | Attending: Nurse Practitioner | Admitting: Nurse Practitioner

## 2019-05-19 DIAGNOSIS — Z1231 Encounter for screening mammogram for malignant neoplasm of breast: Secondary | ICD-10-CM

## 2019-05-19 DIAGNOSIS — E2839 Other primary ovarian failure: Secondary | ICD-10-CM

## 2019-05-19 DIAGNOSIS — Z13820 Encounter for screening for osteoporosis: Secondary | ICD-10-CM | POA: Diagnosis not present

## 2019-05-19 DIAGNOSIS — Z780 Asymptomatic menopausal state: Secondary | ICD-10-CM | POA: Diagnosis not present

## 2019-07-31 DIAGNOSIS — I10 Essential (primary) hypertension: Secondary | ICD-10-CM | POA: Diagnosis not present

## 2019-07-31 DIAGNOSIS — M159 Polyosteoarthritis, unspecified: Secondary | ICD-10-CM | POA: Diagnosis not present

## 2019-07-31 DIAGNOSIS — E785 Hyperlipidemia, unspecified: Secondary | ICD-10-CM | POA: Diagnosis not present

## 2019-07-31 DIAGNOSIS — Z9181 History of falling: Secondary | ICD-10-CM | POA: Diagnosis not present

## 2019-07-31 DIAGNOSIS — R5383 Other fatigue: Secondary | ICD-10-CM | POA: Diagnosis not present

## 2019-07-31 DIAGNOSIS — Z139 Encounter for screening, unspecified: Secondary | ICD-10-CM | POA: Diagnosis not present

## 2019-07-31 DIAGNOSIS — E119 Type 2 diabetes mellitus without complications: Secondary | ICD-10-CM | POA: Diagnosis not present

## 2019-08-14 DIAGNOSIS — Z0000 Encounter for general adult medical examination without abnormal findings: Secondary | ICD-10-CM | POA: Diagnosis not present

## 2019-08-14 DIAGNOSIS — E785 Hyperlipidemia, unspecified: Secondary | ICD-10-CM | POA: Diagnosis not present

## 2019-08-14 DIAGNOSIS — Z139 Encounter for screening, unspecified: Secondary | ICD-10-CM | POA: Diagnosis not present

## 2019-08-14 DIAGNOSIS — Z9181 History of falling: Secondary | ICD-10-CM | POA: Diagnosis not present

## 2019-09-16 DIAGNOSIS — E119 Type 2 diabetes mellitus without complications: Secondary | ICD-10-CM | POA: Diagnosis not present

## 2019-09-16 DIAGNOSIS — H1045 Other chronic allergic conjunctivitis: Secondary | ICD-10-CM | POA: Diagnosis not present

## 2019-09-16 DIAGNOSIS — H401131 Primary open-angle glaucoma, bilateral, mild stage: Secondary | ICD-10-CM | POA: Diagnosis not present

## 2019-10-24 DIAGNOSIS — G2581 Restless legs syndrome: Secondary | ICD-10-CM | POA: Diagnosis not present

## 2019-10-24 DIAGNOSIS — M25432 Effusion, left wrist: Secondary | ICD-10-CM | POA: Diagnosis not present

## 2019-10-24 DIAGNOSIS — M5412 Radiculopathy, cervical region: Secondary | ICD-10-CM | POA: Diagnosis not present

## 2019-11-17 DIAGNOSIS — M2550 Pain in unspecified joint: Secondary | ICD-10-CM | POA: Diagnosis not present

## 2019-11-17 DIAGNOSIS — R768 Other specified abnormal immunological findings in serum: Secondary | ICD-10-CM | POA: Diagnosis not present

## 2019-11-17 DIAGNOSIS — M19041 Primary osteoarthritis, right hand: Secondary | ICD-10-CM | POA: Diagnosis not present

## 2019-11-17 DIAGNOSIS — M19042 Primary osteoarthritis, left hand: Secondary | ICD-10-CM | POA: Diagnosis not present

## 2020-02-10 DIAGNOSIS — M159 Polyosteoarthritis, unspecified: Secondary | ICD-10-CM | POA: Diagnosis not present

## 2020-02-10 DIAGNOSIS — E119 Type 2 diabetes mellitus without complications: Secondary | ICD-10-CM | POA: Diagnosis not present

## 2020-02-10 DIAGNOSIS — I10 Essential (primary) hypertension: Secondary | ICD-10-CM | POA: Diagnosis not present

## 2020-02-10 DIAGNOSIS — R5383 Other fatigue: Secondary | ICD-10-CM | POA: Diagnosis not present

## 2020-02-10 DIAGNOSIS — E785 Hyperlipidemia, unspecified: Secondary | ICD-10-CM | POA: Diagnosis not present

## 2020-06-24 ENCOUNTER — Other Ambulatory Visit: Payer: Self-pay | Admitting: Nurse Practitioner

## 2020-06-24 DIAGNOSIS — Z1231 Encounter for screening mammogram for malignant neoplasm of breast: Secondary | ICD-10-CM

## 2020-07-06 ENCOUNTER — Other Ambulatory Visit: Payer: Self-pay

## 2020-07-06 ENCOUNTER — Ambulatory Visit
Admission: RE | Admit: 2020-07-06 | Discharge: 2020-07-06 | Disposition: A | Discharge status: Home / Self Care | Payer: Medicare Other | Source: Ambulatory Visit | Attending: Nurse Practitioner | Admitting: Nurse Practitioner

## 2020-07-06 DIAGNOSIS — Z1231 Encounter for screening mammogram for malignant neoplasm of breast: Secondary | ICD-10-CM | POA: Diagnosis not present

## 2020-07-20 DIAGNOSIS — J069 Acute upper respiratory infection, unspecified: Secondary | ICD-10-CM | POA: Diagnosis not present

## 2020-07-20 DIAGNOSIS — M436 Torticollis: Secondary | ICD-10-CM | POA: Diagnosis not present

## 2020-08-12 DIAGNOSIS — E119 Type 2 diabetes mellitus without complications: Secondary | ICD-10-CM | POA: Diagnosis not present

## 2020-08-12 DIAGNOSIS — E785 Hyperlipidemia, unspecified: Secondary | ICD-10-CM | POA: Diagnosis not present

## 2020-08-12 DIAGNOSIS — Z20822 Contact with and (suspected) exposure to covid-19: Secondary | ICD-10-CM | POA: Diagnosis not present

## 2020-08-12 DIAGNOSIS — M159 Polyosteoarthritis, unspecified: Secondary | ICD-10-CM | POA: Diagnosis not present

## 2020-08-12 DIAGNOSIS — I10 Essential (primary) hypertension: Secondary | ICD-10-CM | POA: Diagnosis not present

## 2020-08-16 DIAGNOSIS — Z0000 Encounter for general adult medical examination without abnormal findings: Secondary | ICD-10-CM | POA: Diagnosis not present

## 2020-08-16 DIAGNOSIS — Z9181 History of falling: Secondary | ICD-10-CM | POA: Diagnosis not present

## 2020-08-16 DIAGNOSIS — E785 Hyperlipidemia, unspecified: Secondary | ICD-10-CM | POA: Diagnosis not present

## 2020-09-02 DIAGNOSIS — N390 Urinary tract infection, site not specified: Secondary | ICD-10-CM | POA: Diagnosis not present

## 2020-10-03 IMAGING — MG DIGITAL SCREENING BILATERAL MAMMOGRAM WITH TOMO AND CAD
8 series · 8 of 24 positions shown · non-contrast
Comparison: Previous exam(s).

CLINICAL DATA: Screening.

EXAM:
DIGITAL SCREENING BILATERAL MAMMOGRAM WITH TOMO AND CAD

[L CC synth-2D]
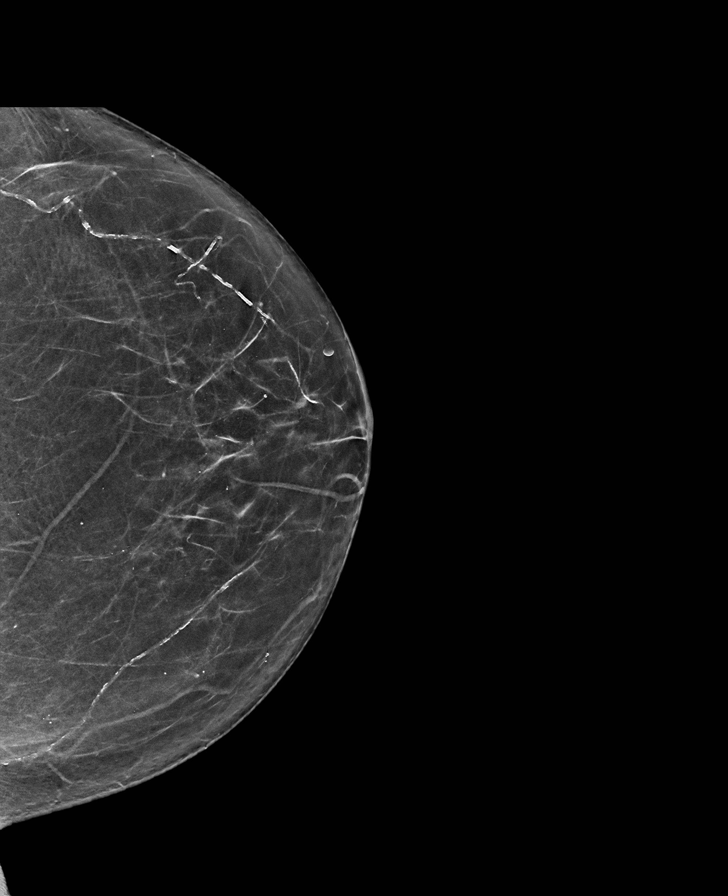

[R CC synth-2D]
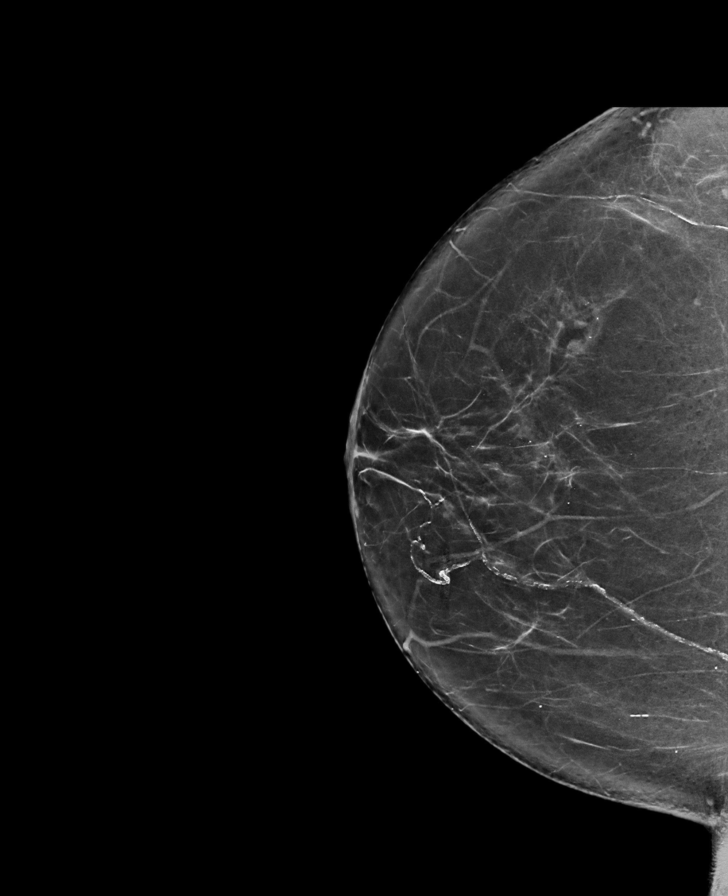

[L MLO synth-2D]
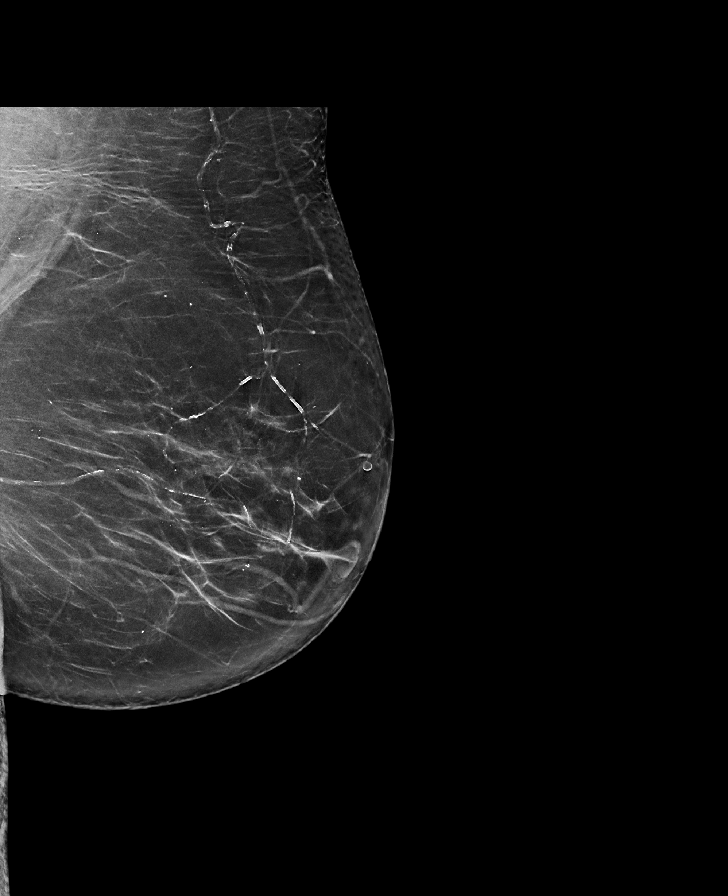

[R MLO synth-2D]
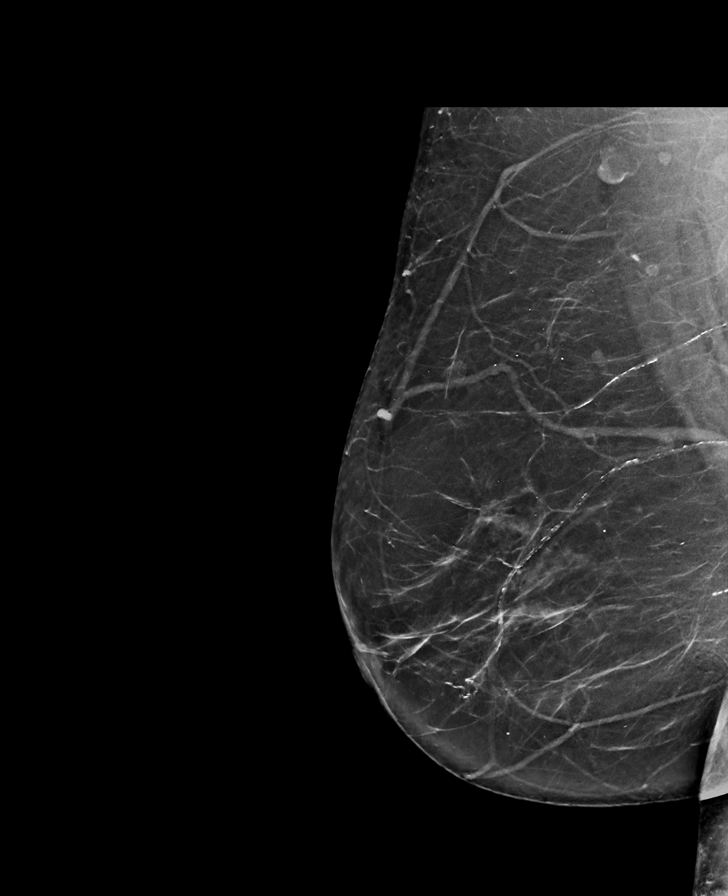

[R CC tomo · tomo slice 38/75.0]
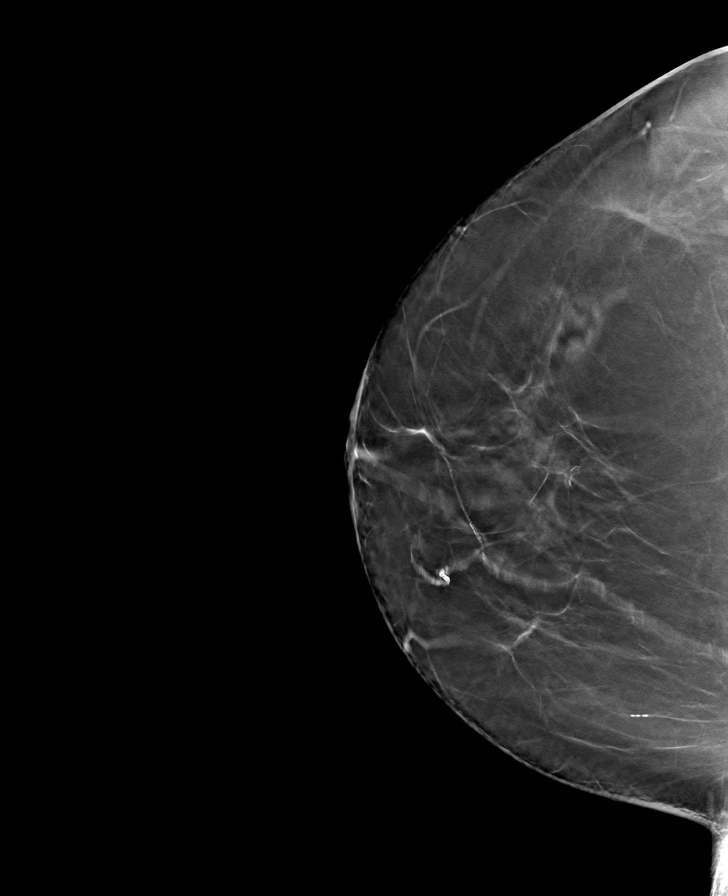

[R MLO tomo · tomo slice 41/82.0]
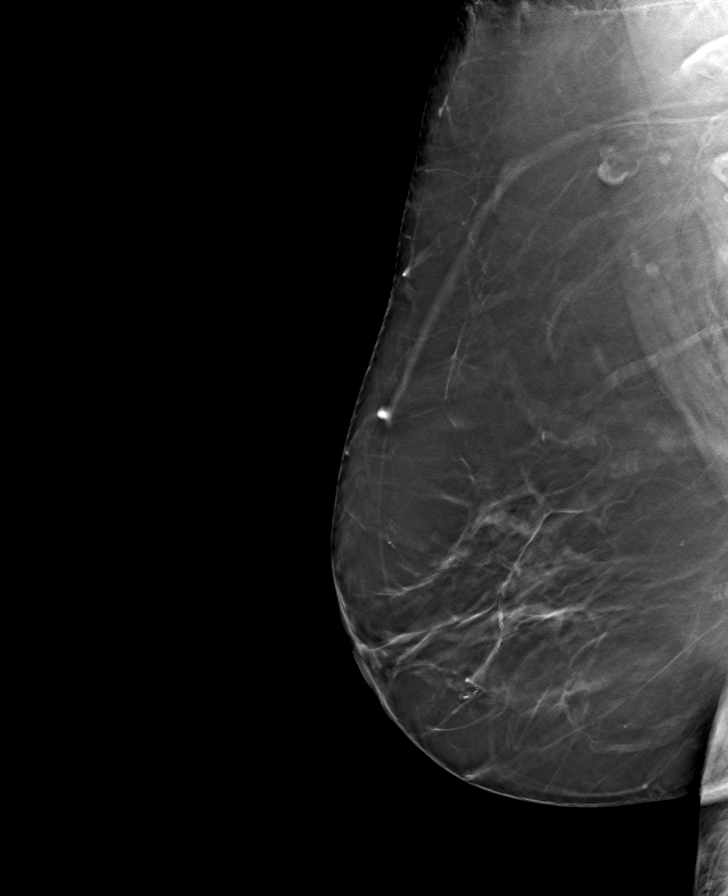

[L CC tomo · tomo slice 35/69.0]
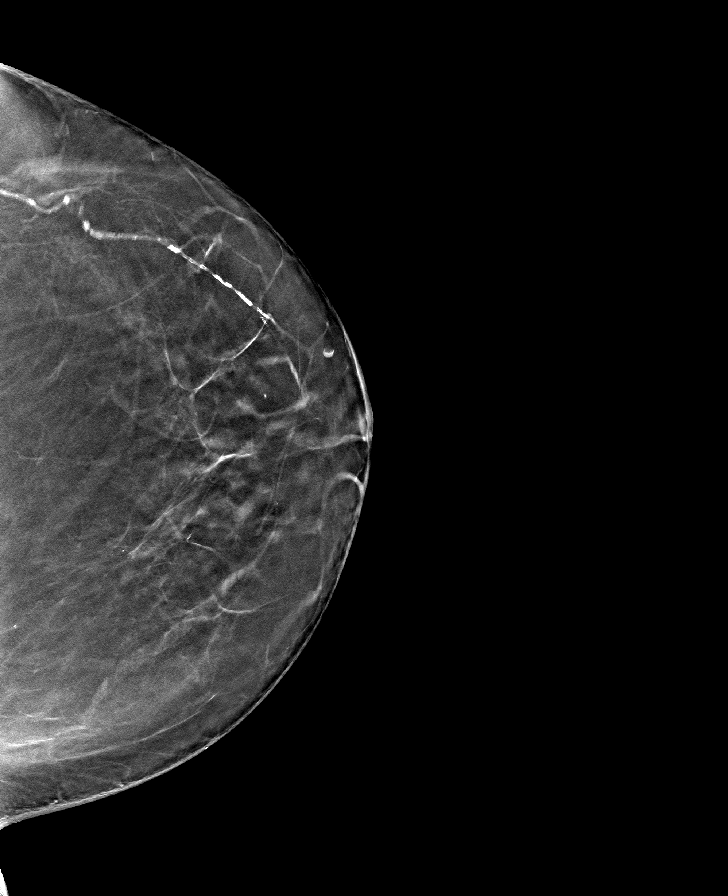

[L MLO tomo · tomo slice 41/81.0]
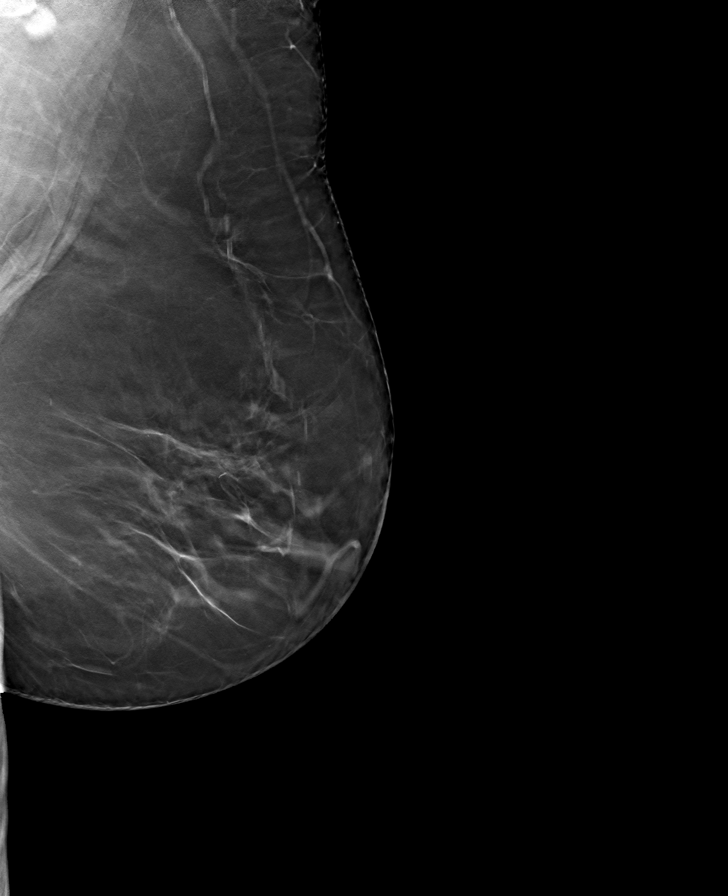

[8 of 24 positions shown; findings below may reference images not displayed]

ACR Breast Density Category b: There are scattered areas of
fibroglandular density.
FINDINGS: There are no findings suspicious for malignancy. Images were
processed with CAD.
IMPRESSION: No mammographic evidence of malignancy. A result letter of this
screening mammogram will be mailed directly to the patient.

RECOMMENDATION:
Screening mammogram in one year. (Code:[id number])

BI-RADS CATEGORY  1: Negative.

## 2020-11-12 DIAGNOSIS — H1045 Other chronic allergic conjunctivitis: Secondary | ICD-10-CM | POA: Diagnosis not present

## 2020-11-12 DIAGNOSIS — E119 Type 2 diabetes mellitus without complications: Secondary | ICD-10-CM | POA: Diagnosis not present

## 2020-11-12 DIAGNOSIS — H40013 Open angle with borderline findings, low risk, bilateral: Secondary | ICD-10-CM | POA: Diagnosis not present

## 2021-01-17 DIAGNOSIS — E1169 Type 2 diabetes mellitus with other specified complication: Secondary | ICD-10-CM | POA: Diagnosis not present

## 2021-01-17 DIAGNOSIS — E785 Hyperlipidemia, unspecified: Secondary | ICD-10-CM | POA: Diagnosis not present

## 2021-02-04 DIAGNOSIS — E785 Hyperlipidemia, unspecified: Secondary | ICD-10-CM | POA: Diagnosis not present

## 2021-02-04 DIAGNOSIS — I10 Essential (primary) hypertension: Secondary | ICD-10-CM | POA: Diagnosis not present

## 2021-02-04 DIAGNOSIS — M159 Polyosteoarthritis, unspecified: Secondary | ICD-10-CM | POA: Diagnosis not present

## 2021-02-04 DIAGNOSIS — E119 Type 2 diabetes mellitus without complications: Secondary | ICD-10-CM | POA: Diagnosis not present

## 2021-02-04 DIAGNOSIS — H6123 Impacted cerumen, bilateral: Secondary | ICD-10-CM | POA: Diagnosis not present

## 2021-05-16 DIAGNOSIS — M159 Polyosteoarthritis, unspecified: Secondary | ICD-10-CM | POA: Diagnosis not present

## 2021-05-16 DIAGNOSIS — I10 Essential (primary) hypertension: Secondary | ICD-10-CM | POA: Diagnosis not present

## 2021-05-16 DIAGNOSIS — E119 Type 2 diabetes mellitus without complications: Secondary | ICD-10-CM | POA: Diagnosis not present

## 2021-05-16 DIAGNOSIS — E785 Hyperlipidemia, unspecified: Secondary | ICD-10-CM | POA: Diagnosis not present

## 2021-05-16 DIAGNOSIS — Z139 Encounter for screening, unspecified: Secondary | ICD-10-CM | POA: Diagnosis not present

## 2021-08-18 DIAGNOSIS — Z0000 Encounter for general adult medical examination without abnormal findings: Secondary | ICD-10-CM | POA: Diagnosis not present

## 2021-08-18 DIAGNOSIS — E785 Hyperlipidemia, unspecified: Secondary | ICD-10-CM | POA: Diagnosis not present

## 2021-08-18 DIAGNOSIS — Z9181 History of falling: Secondary | ICD-10-CM | POA: Diagnosis not present

## 2021-11-02 DIAGNOSIS — Z23 Encounter for immunization: Secondary | ICD-10-CM | POA: Diagnosis not present

## 2021-11-02 DIAGNOSIS — F32A Depression, unspecified: Secondary | ICD-10-CM | POA: Diagnosis not present

## 2021-11-02 DIAGNOSIS — R42 Dizziness and giddiness: Secondary | ICD-10-CM | POA: Diagnosis not present

## 2021-11-02 DIAGNOSIS — E119 Type 2 diabetes mellitus without complications: Secondary | ICD-10-CM | POA: Diagnosis not present

## 2021-11-02 DIAGNOSIS — Z79899 Other long term (current) drug therapy: Secondary | ICD-10-CM | POA: Diagnosis not present

## 2021-11-02 DIAGNOSIS — Z20822 Contact with and (suspected) exposure to covid-19: Secondary | ICD-10-CM | POA: Diagnosis not present

## 2021-11-02 DIAGNOSIS — M545 Low back pain, unspecified: Secondary | ICD-10-CM | POA: Diagnosis not present

## 2021-11-02 DIAGNOSIS — E785 Hyperlipidemia, unspecified: Secondary | ICD-10-CM | POA: Diagnosis not present

## 2021-11-15 DIAGNOSIS — E119 Type 2 diabetes mellitus without complications: Secondary | ICD-10-CM | POA: Diagnosis not present

## 2021-11-15 DIAGNOSIS — H1045 Other chronic allergic conjunctivitis: Secondary | ICD-10-CM | POA: Diagnosis not present

## 2021-11-15 DIAGNOSIS — H40013 Open angle with borderline findings, low risk, bilateral: Secondary | ICD-10-CM | POA: Diagnosis not present

## 2022-01-11 DIAGNOSIS — R079 Chest pain, unspecified: Secondary | ICD-10-CM | POA: Diagnosis not present

## 2022-01-11 DIAGNOSIS — Z20822 Contact with and (suspected) exposure to covid-19: Secondary | ICD-10-CM | POA: Diagnosis not present

## 2022-01-11 DIAGNOSIS — R42 Dizziness and giddiness: Secondary | ICD-10-CM | POA: Diagnosis not present

## 2022-01-11 DIAGNOSIS — I10 Essential (primary) hypertension: Secondary | ICD-10-CM | POA: Diagnosis not present

## 2022-01-11 DIAGNOSIS — I214 Non-ST elevation (NSTEMI) myocardial infarction: Secondary | ICD-10-CM | POA: Diagnosis not present

## 2022-01-11 DIAGNOSIS — E7800 Pure hypercholesterolemia, unspecified: Secondary | ICD-10-CM | POA: Diagnosis not present

## 2022-01-11 DIAGNOSIS — E119 Type 2 diabetes mellitus without complications: Secondary | ICD-10-CM | POA: Diagnosis not present

## 2022-01-11 DIAGNOSIS — Z87891 Personal history of nicotine dependence: Secondary | ICD-10-CM | POA: Diagnosis not present

## 2022-01-11 DIAGNOSIS — I4581 Long QT syndrome: Secondary | ICD-10-CM | POA: Diagnosis not present

## 2022-01-11 DIAGNOSIS — Z880 Allergy status to penicillin: Secondary | ICD-10-CM | POA: Diagnosis not present

## 2022-01-11 DIAGNOSIS — I2109 ST elevation (STEMI) myocardial infarction involving other coronary artery of anterior wall: Secondary | ICD-10-CM | POA: Diagnosis not present

## 2022-01-11 DIAGNOSIS — Z79899 Other long term (current) drug therapy: Secondary | ICD-10-CM | POA: Diagnosis not present

## 2022-01-11 DIAGNOSIS — R072 Precordial pain: Secondary | ICD-10-CM | POA: Diagnosis not present

## 2022-01-11 DIAGNOSIS — R112 Nausea with vomiting, unspecified: Secondary | ICD-10-CM | POA: Diagnosis not present

## 2022-01-11 DIAGNOSIS — Z7982 Long term (current) use of aspirin: Secondary | ICD-10-CM | POA: Diagnosis not present

## 2022-01-11 DIAGNOSIS — J9811 Atelectasis: Secondary | ICD-10-CM | POA: Diagnosis not present

## 2022-01-11 DIAGNOSIS — R0989 Other specified symptoms and signs involving the circulatory and respiratory systems: Secondary | ICD-10-CM | POA: Diagnosis not present

## 2022-01-11 DIAGNOSIS — Z7984 Long term (current) use of oral hypoglycemic drugs: Secondary | ICD-10-CM | POA: Diagnosis not present

## 2022-01-11 DIAGNOSIS — I2510 Atherosclerotic heart disease of native coronary artery without angina pectoris: Secondary | ICD-10-CM | POA: Diagnosis not present

## 2022-01-12 DIAGNOSIS — Z87891 Personal history of nicotine dependence: Secondary | ICD-10-CM | POA: Diagnosis not present

## 2022-01-12 DIAGNOSIS — Z7902 Long term (current) use of antithrombotics/antiplatelets: Secondary | ICD-10-CM | POA: Diagnosis not present

## 2022-01-12 DIAGNOSIS — I351 Nonrheumatic aortic (valve) insufficiency: Secondary | ICD-10-CM | POA: Diagnosis not present

## 2022-01-12 DIAGNOSIS — Z79811 Long term (current) use of aromatase inhibitors: Secondary | ICD-10-CM | POA: Diagnosis not present

## 2022-01-12 DIAGNOSIS — I2109 ST elevation (STEMI) myocardial infarction involving other coronary artery of anterior wall: Secondary | ICD-10-CM | POA: Diagnosis not present

## 2022-01-12 DIAGNOSIS — E7800 Pure hypercholesterolemia, unspecified: Secondary | ICD-10-CM | POA: Diagnosis not present

## 2022-01-12 DIAGNOSIS — Z7982 Long term (current) use of aspirin: Secondary | ICD-10-CM | POA: Diagnosis not present

## 2022-01-12 DIAGNOSIS — I2510 Atherosclerotic heart disease of native coronary artery without angina pectoris: Secondary | ICD-10-CM | POA: Diagnosis not present

## 2022-01-12 DIAGNOSIS — E785 Hyperlipidemia, unspecified: Secondary | ICD-10-CM | POA: Diagnosis not present

## 2022-01-12 DIAGNOSIS — Z880 Allergy status to penicillin: Secondary | ICD-10-CM | POA: Diagnosis not present

## 2022-01-12 DIAGNOSIS — E119 Type 2 diabetes mellitus without complications: Secondary | ICD-10-CM | POA: Diagnosis not present

## 2022-01-12 DIAGNOSIS — R9431 Abnormal electrocardiogram [ECG] [EKG]: Secondary | ICD-10-CM | POA: Diagnosis not present

## 2022-01-12 DIAGNOSIS — K30 Functional dyspepsia: Secondary | ICD-10-CM | POA: Diagnosis not present

## 2022-01-12 DIAGNOSIS — I214 Non-ST elevation (NSTEMI) myocardial infarction: Secondary | ICD-10-CM | POA: Diagnosis not present

## 2022-01-12 DIAGNOSIS — Z7984 Long term (current) use of oral hypoglycemic drugs: Secondary | ICD-10-CM | POA: Diagnosis not present

## 2022-01-12 DIAGNOSIS — Z79899 Other long term (current) drug therapy: Secondary | ICD-10-CM | POA: Diagnosis not present

## 2022-01-12 DIAGNOSIS — I213 ST elevation (STEMI) myocardial infarction of unspecified site: Secondary | ICD-10-CM | POA: Diagnosis not present

## 2022-01-12 DIAGNOSIS — Z20822 Contact with and (suspected) exposure to covid-19: Secondary | ICD-10-CM | POA: Diagnosis not present

## 2022-01-12 DIAGNOSIS — I10 Essential (primary) hypertension: Secondary | ICD-10-CM | POA: Diagnosis not present

## 2022-01-14 DIAGNOSIS — I214 Non-ST elevation (NSTEMI) myocardial infarction: Secondary | ICD-10-CM | POA: Diagnosis not present

## 2022-01-16 DIAGNOSIS — Z79899 Other long term (current) drug therapy: Secondary | ICD-10-CM | POA: Diagnosis not present

## 2022-01-16 DIAGNOSIS — E119 Type 2 diabetes mellitus without complications: Secondary | ICD-10-CM | POA: Diagnosis not present

## 2022-01-16 DIAGNOSIS — Z87891 Personal history of nicotine dependence: Secondary | ICD-10-CM | POA: Diagnosis not present

## 2022-01-16 DIAGNOSIS — R0600 Dyspnea, unspecified: Secondary | ICD-10-CM | POA: Diagnosis not present

## 2022-01-16 DIAGNOSIS — R0602 Shortness of breath: Secondary | ICD-10-CM | POA: Diagnosis not present

## 2022-01-16 DIAGNOSIS — E7800 Pure hypercholesterolemia, unspecified: Secondary | ICD-10-CM | POA: Diagnosis not present

## 2022-01-16 DIAGNOSIS — Z880 Allergy status to penicillin: Secondary | ICD-10-CM | POA: Diagnosis not present

## 2022-01-16 DIAGNOSIS — R9431 Abnormal electrocardiogram [ECG] [EKG]: Secondary | ICD-10-CM | POA: Diagnosis not present

## 2022-01-16 DIAGNOSIS — Z7982 Long term (current) use of aspirin: Secondary | ICD-10-CM | POA: Diagnosis not present

## 2022-01-16 DIAGNOSIS — I10 Essential (primary) hypertension: Secondary | ICD-10-CM | POA: Diagnosis not present

## 2022-01-16 DIAGNOSIS — Z7984 Long term (current) use of oral hypoglycemic drugs: Secondary | ICD-10-CM | POA: Diagnosis not present

## 2022-01-19 DIAGNOSIS — Z09 Encounter for follow-up examination after completed treatment for conditions other than malignant neoplasm: Secondary | ICD-10-CM | POA: Diagnosis not present

## 2022-01-19 DIAGNOSIS — I219 Acute myocardial infarction, unspecified: Secondary | ICD-10-CM | POA: Diagnosis not present

## 2022-01-19 DIAGNOSIS — R0602 Shortness of breath: Secondary | ICD-10-CM | POA: Diagnosis not present

## 2022-01-19 DIAGNOSIS — I2510 Atherosclerotic heart disease of native coronary artery without angina pectoris: Secondary | ICD-10-CM | POA: Diagnosis not present

## 2022-01-25 DIAGNOSIS — I110 Hypertensive heart disease with heart failure: Secondary | ICD-10-CM | POA: Diagnosis not present

## 2022-01-25 DIAGNOSIS — I5020 Unspecified systolic (congestive) heart failure: Secondary | ICD-10-CM | POA: Diagnosis not present

## 2022-01-25 DIAGNOSIS — I2102 ST elevation (STEMI) myocardial infarction involving left anterior descending coronary artery: Secondary | ICD-10-CM | POA: Diagnosis not present

## 2022-01-25 DIAGNOSIS — Z8639 Personal history of other endocrine, nutritional and metabolic disease: Secondary | ICD-10-CM | POA: Diagnosis not present

## 2022-01-25 DIAGNOSIS — Z955 Presence of coronary angioplasty implant and graft: Secondary | ICD-10-CM | POA: Diagnosis not present

## 2022-01-25 DIAGNOSIS — I10 Essential (primary) hypertension: Secondary | ICD-10-CM | POA: Diagnosis not present

## 2022-02-21 DIAGNOSIS — E785 Hyperlipidemia, unspecified: Secondary | ICD-10-CM | POA: Diagnosis not present

## 2022-02-21 DIAGNOSIS — I219 Acute myocardial infarction, unspecified: Secondary | ICD-10-CM | POA: Diagnosis not present

## 2022-02-21 DIAGNOSIS — E119 Type 2 diabetes mellitus without complications: Secondary | ICD-10-CM | POA: Diagnosis not present

## 2022-02-21 DIAGNOSIS — I10 Essential (primary) hypertension: Secondary | ICD-10-CM | POA: Diagnosis not present

## 2022-02-21 DIAGNOSIS — M1990 Unspecified osteoarthritis, unspecified site: Secondary | ICD-10-CM | POA: Diagnosis not present

## 2022-03-01 DIAGNOSIS — I213 ST elevation (STEMI) myocardial infarction of unspecified site: Secondary | ICD-10-CM | POA: Diagnosis not present

## 2022-03-13 DIAGNOSIS — I213 ST elevation (STEMI) myocardial infarction of unspecified site: Secondary | ICD-10-CM | POA: Diagnosis not present

## 2022-03-16 DIAGNOSIS — I213 ST elevation (STEMI) myocardial infarction of unspecified site: Secondary | ICD-10-CM | POA: Diagnosis not present

## 2022-03-20 DIAGNOSIS — I213 ST elevation (STEMI) myocardial infarction of unspecified site: Secondary | ICD-10-CM | POA: Diagnosis not present

## 2022-03-22 DIAGNOSIS — I213 ST elevation (STEMI) myocardial infarction of unspecified site: Secondary | ICD-10-CM | POA: Diagnosis not present

## 2022-03-23 DIAGNOSIS — I213 ST elevation (STEMI) myocardial infarction of unspecified site: Secondary | ICD-10-CM | POA: Diagnosis not present

## 2022-03-27 DIAGNOSIS — I213 ST elevation (STEMI) myocardial infarction of unspecified site: Secondary | ICD-10-CM | POA: Diagnosis not present

## 2022-03-29 DIAGNOSIS — I213 ST elevation (STEMI) myocardial infarction of unspecified site: Secondary | ICD-10-CM | POA: Diagnosis not present

## 2022-03-30 DIAGNOSIS — I213 ST elevation (STEMI) myocardial infarction of unspecified site: Secondary | ICD-10-CM | POA: Diagnosis not present

## 2022-04-01 DIAGNOSIS — M79671 Pain in right foot: Secondary | ICD-10-CM | POA: Diagnosis not present

## 2022-04-01 DIAGNOSIS — E785 Hyperlipidemia, unspecified: Secondary | ICD-10-CM | POA: Diagnosis not present

## 2022-04-01 DIAGNOSIS — S96811A Strain of other specified muscles and tendons at ankle and foot level, right foot, initial encounter: Secondary | ICD-10-CM | POA: Diagnosis not present

## 2022-04-05 DIAGNOSIS — I213 ST elevation (STEMI) myocardial infarction of unspecified site: Secondary | ICD-10-CM | POA: Diagnosis not present

## 2022-04-06 DIAGNOSIS — I213 ST elevation (STEMI) myocardial infarction of unspecified site: Secondary | ICD-10-CM | POA: Diagnosis not present

## 2022-04-10 DIAGNOSIS — I213 ST elevation (STEMI) myocardial infarction of unspecified site: Secondary | ICD-10-CM | POA: Diagnosis not present

## 2022-04-12 DIAGNOSIS — I213 ST elevation (STEMI) myocardial infarction of unspecified site: Secondary | ICD-10-CM | POA: Diagnosis not present

## 2022-04-13 DIAGNOSIS — I213 ST elevation (STEMI) myocardial infarction of unspecified site: Secondary | ICD-10-CM | POA: Diagnosis not present

## 2022-04-17 DIAGNOSIS — M25539 Pain in unspecified wrist: Secondary | ICD-10-CM | POA: Diagnosis not present

## 2022-04-19 DIAGNOSIS — Z8639 Personal history of other endocrine, nutritional and metabolic disease: Secondary | ICD-10-CM | POA: Diagnosis not present

## 2022-04-19 DIAGNOSIS — I10 Essential (primary) hypertension: Secondary | ICD-10-CM | POA: Diagnosis not present

## 2022-04-19 DIAGNOSIS — I2102 ST elevation (STEMI) myocardial infarction involving left anterior descending coronary artery: Secondary | ICD-10-CM | POA: Diagnosis not present

## 2022-04-19 DIAGNOSIS — I5020 Unspecified systolic (congestive) heart failure: Secondary | ICD-10-CM | POA: Diagnosis not present

## 2022-04-19 DIAGNOSIS — Z955 Presence of coronary angioplasty implant and graft: Secondary | ICD-10-CM | POA: Diagnosis not present

## 2022-04-24 DIAGNOSIS — M109 Gout, unspecified: Secondary | ICD-10-CM | POA: Diagnosis not present

## 2022-04-24 DIAGNOSIS — E1122 Type 2 diabetes mellitus with diabetic chronic kidney disease: Secondary | ICD-10-CM | POA: Diagnosis not present

## 2022-04-24 DIAGNOSIS — N1832 Chronic kidney disease, stage 3b: Secondary | ICD-10-CM | POA: Diagnosis not present

## 2022-04-26 DIAGNOSIS — I509 Heart failure, unspecified: Secondary | ICD-10-CM | POA: Diagnosis not present

## 2022-04-26 DIAGNOSIS — I10 Essential (primary) hypertension: Secondary | ICD-10-CM | POA: Diagnosis not present

## 2022-04-26 DIAGNOSIS — I2102 ST elevation (STEMI) myocardial infarction involving left anterior descending coronary artery: Secondary | ICD-10-CM | POA: Diagnosis not present

## 2022-04-26 DIAGNOSIS — I213 ST elevation (STEMI) myocardial infarction of unspecified site: Secondary | ICD-10-CM | POA: Diagnosis not present

## 2022-04-26 DIAGNOSIS — Z8639 Personal history of other endocrine, nutritional and metabolic disease: Secondary | ICD-10-CM | POA: Diagnosis not present

## 2022-04-26 DIAGNOSIS — Z955 Presence of coronary angioplasty implant and graft: Secondary | ICD-10-CM | POA: Diagnosis not present

## 2022-04-26 DIAGNOSIS — I5020 Unspecified systolic (congestive) heart failure: Secondary | ICD-10-CM | POA: Diagnosis not present

## 2022-04-27 DIAGNOSIS — I213 ST elevation (STEMI) myocardial infarction of unspecified site: Secondary | ICD-10-CM | POA: Diagnosis not present

## 2022-05-03 DIAGNOSIS — I213 ST elevation (STEMI) myocardial infarction of unspecified site: Secondary | ICD-10-CM | POA: Diagnosis not present

## 2022-05-05 DIAGNOSIS — M109 Gout, unspecified: Secondary | ICD-10-CM | POA: Diagnosis not present

## 2022-05-08 DIAGNOSIS — I213 ST elevation (STEMI) myocardial infarction of unspecified site: Secondary | ICD-10-CM | POA: Diagnosis not present

## 2022-05-10 DIAGNOSIS — I213 ST elevation (STEMI) myocardial infarction of unspecified site: Secondary | ICD-10-CM | POA: Diagnosis not present

## 2022-05-11 DIAGNOSIS — I213 ST elevation (STEMI) myocardial infarction of unspecified site: Secondary | ICD-10-CM | POA: Diagnosis not present

## 2022-05-15 DIAGNOSIS — I213 ST elevation (STEMI) myocardial infarction of unspecified site: Secondary | ICD-10-CM | POA: Diagnosis not present

## 2022-05-17 DIAGNOSIS — I10 Essential (primary) hypertension: Secondary | ICD-10-CM | POA: Diagnosis not present

## 2022-05-17 DIAGNOSIS — I509 Heart failure, unspecified: Secondary | ICD-10-CM | POA: Diagnosis not present

## 2022-05-17 DIAGNOSIS — R799 Abnormal finding of blood chemistry, unspecified: Secondary | ICD-10-CM | POA: Diagnosis not present

## 2022-05-18 DIAGNOSIS — M25532 Pain in left wrist: Secondary | ICD-10-CM | POA: Diagnosis not present

## 2022-05-18 DIAGNOSIS — Z8739 Personal history of other diseases of the musculoskeletal system and connective tissue: Secondary | ICD-10-CM | POA: Diagnosis not present

## 2022-05-18 DIAGNOSIS — M25432 Effusion, left wrist: Secondary | ICD-10-CM | POA: Diagnosis not present

## 2022-05-29 DIAGNOSIS — I213 ST elevation (STEMI) myocardial infarction of unspecified site: Secondary | ICD-10-CM | POA: Diagnosis not present

## 2022-05-31 DIAGNOSIS — I213 ST elevation (STEMI) myocardial infarction of unspecified site: Secondary | ICD-10-CM | POA: Diagnosis not present

## 2022-06-01 DIAGNOSIS — I213 ST elevation (STEMI) myocardial infarction of unspecified site: Secondary | ICD-10-CM | POA: Diagnosis not present

## 2022-06-02 DIAGNOSIS — M79632 Pain in left forearm: Secondary | ICD-10-CM | POA: Diagnosis not present

## 2022-06-05 DIAGNOSIS — I213 ST elevation (STEMI) myocardial infarction of unspecified site: Secondary | ICD-10-CM | POA: Diagnosis not present

## 2022-06-07 DIAGNOSIS — I213 ST elevation (STEMI) myocardial infarction of unspecified site: Secondary | ICD-10-CM | POA: Diagnosis not present

## 2022-06-08 DIAGNOSIS — I213 ST elevation (STEMI) myocardial infarction of unspecified site: Secondary | ICD-10-CM | POA: Diagnosis not present

## 2022-06-12 DIAGNOSIS — I509 Heart failure, unspecified: Secondary | ICD-10-CM | POA: Diagnosis not present

## 2022-06-12 DIAGNOSIS — I213 ST elevation (STEMI) myocardial infarction of unspecified site: Secondary | ICD-10-CM | POA: Diagnosis not present

## 2022-06-14 DIAGNOSIS — I213 ST elevation (STEMI) myocardial infarction of unspecified site: Secondary | ICD-10-CM | POA: Diagnosis not present

## 2022-06-21 DIAGNOSIS — Z7984 Long term (current) use of oral hypoglycemic drugs: Secondary | ICD-10-CM | POA: Diagnosis not present

## 2022-06-21 DIAGNOSIS — M109 Gout, unspecified: Secondary | ICD-10-CM | POA: Diagnosis not present

## 2022-06-21 DIAGNOSIS — I10 Essential (primary) hypertension: Secondary | ICD-10-CM | POA: Diagnosis not present

## 2022-06-21 DIAGNOSIS — I2102 ST elevation (STEMI) myocardial infarction involving left anterior descending coronary artery: Secondary | ICD-10-CM | POA: Diagnosis not present

## 2022-06-21 DIAGNOSIS — R059 Cough, unspecified: Secondary | ICD-10-CM | POA: Diagnosis not present

## 2022-06-21 DIAGNOSIS — E119 Type 2 diabetes mellitus without complications: Secondary | ICD-10-CM | POA: Diagnosis not present

## 2022-06-21 DIAGNOSIS — E1100 Type 2 diabetes mellitus with hyperosmolarity without nonketotic hyperglycemic-hyperosmolar coma (NKHHC): Secondary | ICD-10-CM | POA: Diagnosis not present

## 2022-06-26 DIAGNOSIS — I213 ST elevation (STEMI) myocardial infarction of unspecified site: Secondary | ICD-10-CM | POA: Diagnosis not present

## 2022-06-28 DIAGNOSIS — Z8639 Personal history of other endocrine, nutritional and metabolic disease: Secondary | ICD-10-CM | POA: Diagnosis not present

## 2022-06-28 DIAGNOSIS — I213 ST elevation (STEMI) myocardial infarction of unspecified site: Secondary | ICD-10-CM | POA: Diagnosis not present

## 2022-06-28 DIAGNOSIS — I2102 ST elevation (STEMI) myocardial infarction involving left anterior descending coronary artery: Secondary | ICD-10-CM | POA: Diagnosis not present

## 2022-06-28 DIAGNOSIS — R799 Abnormal finding of blood chemistry, unspecified: Secondary | ICD-10-CM | POA: Diagnosis not present

## 2022-06-28 DIAGNOSIS — I10 Essential (primary) hypertension: Secondary | ICD-10-CM | POA: Diagnosis not present

## 2022-06-28 DIAGNOSIS — Z955 Presence of coronary angioplasty implant and graft: Secondary | ICD-10-CM | POA: Diagnosis not present

## 2022-06-28 DIAGNOSIS — I509 Heart failure, unspecified: Secondary | ICD-10-CM | POA: Diagnosis not present

## 2022-06-29 DIAGNOSIS — I213 ST elevation (STEMI) myocardial infarction of unspecified site: Secondary | ICD-10-CM | POA: Diagnosis not present

## 2022-07-03 DIAGNOSIS — I213 ST elevation (STEMI) myocardial infarction of unspecified site: Secondary | ICD-10-CM | POA: Diagnosis not present

## 2022-07-05 DIAGNOSIS — I213 ST elevation (STEMI) myocardial infarction of unspecified site: Secondary | ICD-10-CM | POA: Diagnosis not present

## 2022-07-06 DIAGNOSIS — I213 ST elevation (STEMI) myocardial infarction of unspecified site: Secondary | ICD-10-CM | POA: Diagnosis not present

## 2022-07-26 DIAGNOSIS — D509 Iron deficiency anemia, unspecified: Secondary | ICD-10-CM | POA: Diagnosis not present

## 2022-07-26 DIAGNOSIS — Z955 Presence of coronary angioplasty implant and graft: Secondary | ICD-10-CM | POA: Diagnosis not present

## 2022-07-26 DIAGNOSIS — Z8639 Personal history of other endocrine, nutritional and metabolic disease: Secondary | ICD-10-CM | POA: Diagnosis not present

## 2022-07-26 DIAGNOSIS — R0600 Dyspnea, unspecified: Secondary | ICD-10-CM | POA: Diagnosis not present

## 2022-07-26 DIAGNOSIS — I5022 Chronic systolic (congestive) heart failure: Secondary | ICD-10-CM | POA: Diagnosis not present

## 2022-07-26 DIAGNOSIS — I509 Heart failure, unspecified: Secondary | ICD-10-CM | POA: Diagnosis not present

## 2022-07-26 DIAGNOSIS — I10 Essential (primary) hypertension: Secondary | ICD-10-CM | POA: Diagnosis not present

## 2022-07-31 DIAGNOSIS — I351 Nonrheumatic aortic (valve) insufficiency: Secondary | ICD-10-CM | POA: Diagnosis not present

## 2022-07-31 DIAGNOSIS — I5022 Chronic systolic (congestive) heart failure: Secondary | ICD-10-CM | POA: Diagnosis not present

## 2022-07-31 DIAGNOSIS — I358 Other nonrheumatic aortic valve disorders: Secondary | ICD-10-CM | POA: Diagnosis not present

## 2022-08-22 DIAGNOSIS — Z23 Encounter for immunization: Secondary | ICD-10-CM | POA: Diagnosis not present

## 2022-08-22 DIAGNOSIS — I2102 ST elevation (STEMI) myocardial infarction involving left anterior descending coronary artery: Secondary | ICD-10-CM | POA: Diagnosis not present

## 2022-08-22 DIAGNOSIS — Z7984 Long term (current) use of oral hypoglycemic drugs: Secondary | ICD-10-CM | POA: Diagnosis not present

## 2022-08-22 DIAGNOSIS — M109 Gout, unspecified: Secondary | ICD-10-CM | POA: Diagnosis not present

## 2022-08-22 DIAGNOSIS — E1100 Type 2 diabetes mellitus with hyperosmolarity without nonketotic hyperglycemic-hyperosmolar coma (NKHHC): Secondary | ICD-10-CM | POA: Diagnosis not present

## 2022-08-22 DIAGNOSIS — Z0000 Encounter for general adult medical examination without abnormal findings: Secondary | ICD-10-CM | POA: Diagnosis not present

## 2022-08-22 DIAGNOSIS — I10 Essential (primary) hypertension: Secondary | ICD-10-CM | POA: Diagnosis not present
